# Patient Record
Sex: Male | Born: 1990 | Race: Black or African American | Hispanic: No | Marital: Single | Smoking: Never smoker
Health system: Southern US, Community
[De-identification: ages and names within clinical notes are randomized; demographics above are authoritative.]

## PROBLEM LIST (undated history)

## (undated) DIAGNOSIS — I10 Essential (primary) hypertension: Secondary | ICD-10-CM

## (undated) HISTORY — PX: APPENDECTOMY: SHX54

## (undated) HISTORY — PX: OTHER SURGICAL HISTORY: SHX169

## (undated) HISTORY — PX: TESTICLE SURGERY: SHX794

---

## 2008-05-12 ENCOUNTER — Emergency Department (HOSPITAL_BASED_OUTPATIENT_CLINIC_OR_DEPARTMENT_OTHER): Admission: EM | Admit: 2008-05-12 | Discharge: 2008-05-12 | Payer: Self-pay | Admitting: Emergency Medicine

## 2008-05-23 ENCOUNTER — Emergency Department (HOSPITAL_BASED_OUTPATIENT_CLINIC_OR_DEPARTMENT_OTHER): Admission: EM | Admit: 2008-05-23 | Discharge: 2008-05-23 | Payer: Self-pay | Admitting: Emergency Medicine

## 2010-02-19 ENCOUNTER — Emergency Department (HOSPITAL_BASED_OUTPATIENT_CLINIC_OR_DEPARTMENT_OTHER): Admission: EM | Admit: 2010-02-19 | Discharge: 2010-02-19 | Payer: Self-pay | Admitting: Internal Medicine

## 2011-03-26 LAB — STREP A DNA PROBE: Group A Strep Probe: NEGATIVE

## 2011-03-26 LAB — RAPID STREP SCREEN (MED CTR MEBANE ONLY): Streptococcus, Group A Screen (Direct): NEGATIVE

## 2013-03-16 ENCOUNTER — Encounter (HOSPITAL_BASED_OUTPATIENT_CLINIC_OR_DEPARTMENT_OTHER): Payer: Self-pay | Admitting: Emergency Medicine

## 2013-03-16 ENCOUNTER — Emergency Department (HOSPITAL_BASED_OUTPATIENT_CLINIC_OR_DEPARTMENT_OTHER)
Admission: EM | Admit: 2013-03-16 | Discharge: 2013-03-16 | Disposition: A | Discharge status: Home / Self Care | Payer: BC Managed Care – PPO | Attending: Emergency Medicine | Admitting: Emergency Medicine

## 2013-03-16 DIAGNOSIS — H53149 Visual discomfort, unspecified: Secondary | ICD-10-CM | POA: Insufficient documentation

## 2013-03-16 DIAGNOSIS — I10 Essential (primary) hypertension: Secondary | ICD-10-CM | POA: Insufficient documentation

## 2013-03-16 DIAGNOSIS — R51 Headache: Secondary | ICD-10-CM | POA: Insufficient documentation

## 2013-03-16 HISTORY — DX: Essential (primary) hypertension: I10

## 2013-03-16 NOTE — ED Notes (Signed)
HA yesterday and today.  3/10. Denies photophobia, N/V. Has not taken any otc meds today. Took 2 BC and Excederin MIgraine yesterday but it came back today.

## 2013-03-16 NOTE — ED Provider Notes (Signed)
CSN: [id number]     Arrival date & time 03/16/13  1629 History   First MD Initiated Contact with Patient 03/16/13 1746     Chief Complaint  Patient presents with  . Headache   (Consider location/radiation/quality/duration/timing/severity/associated sxs/prior Treatment) HPI Comments: 22 year old male presents with waxing and waning headache since 2 days ago. He says the headache started gradually and feels like a throbbing. The pain started in his occipital area but now is more located in the front. Pain is usually worse when he's doing activities. He denies any neck pain, blurry vision, fever, URI symptoms, numbness, or weakness. The pain improved after BC and Excedrin. The pain is improved currently and is only a 2/10. He does not currently want any medications. He gets headaches like these about once a month. He has not seen any doctors for this. He is most concerned about whether or not the headache has a serious cause.  The history is provided by the patient.    Past Medical History  Diagnosis Date  . Hypertension    Past Surgical History  Procedure Laterality Date  . Appendectomy     No family history on file. History  Substance Use Topics  . Smoking status: Never Smoker   . Smokeless tobacco: Never Used  . Alcohol Use: Yes     Comment: occ    Review of Systems  Constitutional: Negative for fever and chills.  HENT: Negative for congestion, rhinorrhea, neck pain and neck stiffness.   Eyes: Positive for photophobia (intermittently). Negative for pain and visual disturbance.  Respiratory: Negative for cough and shortness of breath.   Gastrointestinal: Negative for nausea and vomiting.  Musculoskeletal: Negative for gait problem.  Neurological: Positive for headaches. Negative for dizziness, weakness, light-headedness and numbness.  All other systems reviewed and are negative.    Allergies  Review of patient's allergies indicates no known allergies.  Home Medications   No current outpatient prescriptions on file. BP 143/60  Pulse 88  Temp(Src) 98.9 F (37.2 C) (Oral)  Resp 20  Ht 5\' 9"  (1.753 m)  Wt 220 lb (99.791 kg)  BMI 32.47 kg/m2  SpO2 99% Physical Exam  Nursing note and vitals reviewed. Constitutional: He is oriented to person, place, and time. He appears well-developed and well-nourished.  HENT:  Head: Normocephalic and atraumatic.  Right Ear: External ear normal.  Left Ear: External ear normal.  Nose: Nose normal.  Mouth/Throat: Oropharynx is clear and moist. No oropharyngeal exudate.  Eyes: Right eye exhibits no discharge. Left eye exhibits no discharge.  Neck: Normal range of motion and full passive range of motion without pain. Neck supple. No spinous process tenderness and no muscular tenderness present.  Pulmonary/Chest: Effort normal and breath sounds normal.  Abdominal: He exhibits no distension.  Musculoskeletal: He exhibits no edema.  Neurological: He is alert and oriented to person, place, and time. He has normal strength and normal reflexes. No cranial nerve deficit or sensory deficit. He exhibits normal muscle tone. Coordination and gait normal. GCS eye subscore is 4. GCS verbal subscore is 5. GCS motor subscore is 6.  Skin: Skin is warm and dry.    ED Course  Procedures (including critical care time) Labs Review Labs Reviewed - No data to display Imaging Review No results found.  MDM   1. Headache    Patient with recurrent headache. This pain has improved with symptomatic care at home. He presented today as he is concerned about a possible dangerous etiology. As he has a  normal neurologic exam, no visual symptoms, no cerebellar symptoms no neck stiffness or fever I feel acute intracranial pathology is very unlikely. I feel that as he has had symptoms like this for several years intermittently that this is more likely a migraine or tension-like headache. We'll treat symptomatically and recommended following up with a PCP  or headache specialist for further evaluation. Patient states he understands return precautions.    Audree Camel, MD 03/16/13 2112

## 2014-01-12 ENCOUNTER — Ambulatory Visit (INDEPENDENT_AMBULATORY_CARE_PROVIDER_SITE_OTHER): Payer: BC Managed Care – PPO | Admitting: Family Medicine

## 2014-01-12 VITALS — BP 112/80 | HR 68 | Temp 98.0°F | Resp 16 | Ht 69.0 in | Wt 224.0 lb

## 2014-01-12 DIAGNOSIS — Z0000 Encounter for general adult medical examination without abnormal findings: Secondary | ICD-10-CM

## 2014-01-12 LAB — POCT URINALYSIS DIPSTICK
Bilirubin, UA: NEGATIVE
Blood, UA: NEGATIVE
Glucose, UA: NEGATIVE
Ketones, UA: NEGATIVE
Leukocytes, UA: NEGATIVE
Nitrite, UA: NEGATIVE
Protein, UA: NEGATIVE
Spec Grav, UA: 1.025
Urobilinogen, UA: 1
pH, UA: 6
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
## 2014-01-13 ENCOUNTER — Encounter: Payer: Self-pay | Admitting: Family Medicine

## 2014-01-13 LAB — CBC
HCT: 44.3 % (ref 39.0–52.0)
Hemoglobin: 15.3 g/dL (ref 13.0–17.0)
MCH: 28.1 pg (ref 26.0–34.0)
MCHC: 34.5 g/dL (ref 30.0–36.0)
MCV: 81.3 fL (ref 78.0–100.0)
Platelets: 262 10*3/uL (ref 150–400)
RBC: 5.45 MIL/uL (ref 4.22–5.81)
RDW: 14.3 % (ref 11.5–15.5)
WBC: 9 10*3/uL (ref 4.0–10.5)

## 2014-01-13 LAB — COMPLETE METABOLIC PANEL WITHOUT GFR
Albumin: 4.2 g/dL (ref 3.5–5.2)
BUN: 15 mg/dL (ref 6–23)
CO2: 28 meq/L (ref 19–32)
Calcium: 9.4 mg/dL (ref 8.4–10.5)
Chloride: 103 meq/L (ref 96–112)
GFR, Est African American: >89 mL/min
GFR, Est Non African American: 78 mL/min
Glucose, Bld: 79 mg/dL (ref 70–99)
Sodium: 139 meq/L (ref 135–145)
Total Protein: 7.5 g/dL (ref 6.0–8.3)

## 2014-01-13 LAB — COMPLETE METABOLIC PANEL WITH GFR
ALT: 38 U/L (ref 0–53)
AST: 35 U/L (ref 0–37)
Alkaline Phosphatase: 46 U/L (ref 39–117)
Creat: 1.27 mg/dL (ref 0.50–1.35)
Potassium: 4.5 mEq/L (ref 3.5–5.3)
Total Bilirubin: 0.4 mg/dL (ref 0.2–1.2)

## 2014-01-13 LAB — LDL CHOLESTEROL, DIRECT: Direct LDL: 95 mg/dL

## 2014-01-13 LAB — TSH: TSH: 1.523 u[IU]/mL (ref 0.350–4.500)

## 2015-02-21 ENCOUNTER — Emergency Department (HOSPITAL_BASED_OUTPATIENT_CLINIC_OR_DEPARTMENT_OTHER)
Admission: EM | Admit: 2015-02-21 | Discharge: 2015-02-21 | Disposition: A | Discharge status: Home / Self Care | Payer: 59 | Attending: Emergency Medicine | Admitting: Emergency Medicine

## 2015-02-21 ENCOUNTER — Encounter (HOSPITAL_BASED_OUTPATIENT_CLINIC_OR_DEPARTMENT_OTHER): Payer: Self-pay | Admitting: Emergency Medicine

## 2015-02-21 ENCOUNTER — Emergency Department (HOSPITAL_BASED_OUTPATIENT_CLINIC_OR_DEPARTMENT_OTHER): Payer: 59

## 2015-02-21 DIAGNOSIS — Y998 Other external cause status: Secondary | ICD-10-CM | POA: Insufficient documentation

## 2015-02-21 DIAGNOSIS — X58XXXA Exposure to other specified factors, initial encounter: Secondary | ICD-10-CM | POA: Diagnosis not present

## 2015-02-21 DIAGNOSIS — Y92310 Basketball court as the place of occurrence of the external cause: Secondary | ICD-10-CM | POA: Diagnosis not present

## 2015-02-21 DIAGNOSIS — Y9367 Activity, basketball: Secondary | ICD-10-CM | POA: Diagnosis not present

## 2015-02-21 DIAGNOSIS — S93602A Unspecified sprain of left foot, initial encounter: Secondary | ICD-10-CM | POA: Diagnosis not present

## 2015-02-21 DIAGNOSIS — S99922A Unspecified injury of left foot, initial encounter: Secondary | ICD-10-CM | POA: Diagnosis present

## 2015-02-21 NOTE — ED Notes (Signed)
Patient had pain to his left foot x 1 day
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
## 2015-02-21 NOTE — Discharge Instructions (Signed)
Rest. Ice for 20 minutes every 2 hours while awake for the next 2 days.  Keep your foot elevated.  Ibuprofen 600 mg every 6 hours as needed for pain.  Follow-up with your primary Dr. if not improving in the next week.   Foot Sprain The muscles and cord like structures which attach muscle to bone (tendons) that surround the feet are made up of units. A foot sprain can occur at the weakest spot in any of these units. This condition is most often caused by injury to or overuse of the foot, as from playing contact sports, or aggravating a previous injury, or from poor conditioning, or obesity. SYMPTOMS  Pain with movement of the foot.  Tenderness and swelling at the injury site.  Loss of strength is present in moderate or severe sprains. THE THREE GRADES OR SEVERITY OF FOOT SPRAIN ARE:  Mild (Grade I): Slightly pulled muscle without tearing of muscle or tendon fibers or loss of strength.  Moderate (Grade II): Tearing of fibers in a muscle, tendon, or at the attachment to bone, with small decrease in strength.  Severe (Grade III): Rupture of the muscle-tendon-bone attachment, with separation of fibers. Severe sprain requires surgical repair. Often repeating (chronic) sprains are caused by overuse. Sudden (acute) sprains are caused by direct injury or over-use. DIAGNOSIS  Diagnosis of this condition is usually by your own observation. If problems continue, a caregiver may be required for further evaluation and treatment. X-rays may be required to make sure there are not breaks in the bones (fractures) present. Continued problems may require physical therapy for treatment. PREVENTION  Use strength and conditioning exercises appropriate for your sport.  Warm up properly prior to working out.  Use athletic shoes that are made for the sport you are participating in.  Allow adequate time for healing. Early return to activities makes repeat injury more likely, and can lead to an unstable  arthritic foot that can result in prolonged disability. Mild sprains generally heal in 3 to 10 days, with moderate and severe sprains taking 2 to 10 weeks. Your caregiver can help you determine the proper time required for healing. HOME CARE INSTRUCTIONS   Apply ice to the injury for 15-20 minutes, 03-04 times per day. Put the ice in a plastic bag and place a towel between the bag of ice and your skin.  An elastic wrap (like an Ace bandage) may be used to keep swelling down.  Keep foot above the level of the heart, or at least raised on a footstool, when swelling and pain are present.  Try to avoid use other than gentle range of motion while the foot is painful. Do not resume use until instructed by your caregiver. Then begin use gradually, not increasing use to the point of pain. If pain does develop, decrease use and continue the above measures, gradually increasing activities that do not cause discomfort, until you gradually achieve normal use.  Use crutches if and as instructed, and for the length of time instructed.  Keep injured foot and ankle wrapped between treatments.  Massage foot and ankle for comfort and to keep swelling down. Massage from the toes up towards the knee.  Only take over-the-counter or prescription medicines for pain, discomfort, or fever as directed by your caregiver. SEEK IMMEDIATE MEDICAL CARE IF:   Your pain and swelling increase, or pain is not controlled with medications.  You have loss of feeling in your foot or your foot turns cold or blue.  You  develop new, unexplained symptoms, or an increase of the symptoms that brought you to your caregiver. MAKE SURE YOU:   Understand these instructions.  Will watch your condition.  Will get help right away if you are not doing well or get worse. Document Released: 11/30/2001 Document Revised: 09/02/2011 Document Reviewed: 01/28/2008 [facility] Patient Information 2015 [place], [place]. This information is not  intended to replace advice given to you by your health care provider. Make sure you discuss any questions you have with your health care provider.

## 2015-05-04 ENCOUNTER — Encounter (HOSPITAL_BASED_OUTPATIENT_CLINIC_OR_DEPARTMENT_OTHER): Payer: Self-pay

## 2015-05-04 ENCOUNTER — Emergency Department (HOSPITAL_BASED_OUTPATIENT_CLINIC_OR_DEPARTMENT_OTHER): Payer: 59

## 2015-05-04 ENCOUNTER — Emergency Department (HOSPITAL_BASED_OUTPATIENT_CLINIC_OR_DEPARTMENT_OTHER)
Admission: EM | Admit: 2015-05-04 | Discharge: 2015-05-04 | Disposition: A | Discharge status: Home / Self Care | Payer: 59 | Attending: Emergency Medicine | Admitting: Emergency Medicine

## 2015-05-04 DIAGNOSIS — S8992XA Unspecified injury of left lower leg, initial encounter: Secondary | ICD-10-CM | POA: Insufficient documentation

## 2015-05-04 DIAGNOSIS — S39012A Strain of muscle, fascia and tendon of lower back, initial encounter: Secondary | ICD-10-CM | POA: Insufficient documentation

## 2015-05-04 DIAGNOSIS — S99922A Unspecified injury of left foot, initial encounter: Secondary | ICD-10-CM | POA: Diagnosis not present

## 2015-05-04 DIAGNOSIS — Y92410 Unspecified street and highway as the place of occurrence of the external cause: Secondary | ICD-10-CM | POA: Insufficient documentation

## 2015-05-04 DIAGNOSIS — M79672 Pain in left foot: Secondary | ICD-10-CM

## 2015-05-04 DIAGNOSIS — Y9389 Activity, other specified: Secondary | ICD-10-CM | POA: Diagnosis not present

## 2015-05-04 DIAGNOSIS — Y998 Other external cause status: Secondary | ICD-10-CM | POA: Diagnosis not present

## 2015-05-04 DIAGNOSIS — M25562 Pain in left knee: Secondary | ICD-10-CM

## 2015-05-04 MED ORDER — IBUPROFEN 600 MG PO TABS: 600.0000 mg | ORAL_TABLET | Freq: Four times a day (QID) | ORAL | Status: AC | PRN

## 2015-05-04 NOTE — ED Notes (Signed)
np at bedside
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
## 2015-05-04 NOTE — Discharge Instructions (Signed)

## 2015-05-06 ENCOUNTER — Emergency Department (HOSPITAL_BASED_OUTPATIENT_CLINIC_OR_DEPARTMENT_OTHER): Payer: 59

## 2015-05-06 ENCOUNTER — Emergency Department (HOSPITAL_BASED_OUTPATIENT_CLINIC_OR_DEPARTMENT_OTHER)
Admission: EM | Admit: 2015-05-06 | Discharge: 2015-05-06 | Disposition: A | Discharge status: Home / Self Care | Payer: 59 | Attending: Physician Assistant | Admitting: Physician Assistant

## 2015-05-06 ENCOUNTER — Encounter (HOSPITAL_BASED_OUTPATIENT_CLINIC_OR_DEPARTMENT_OTHER): Payer: Self-pay | Admitting: *Deleted

## 2015-05-06 DIAGNOSIS — R030 Elevated blood-pressure reading, without diagnosis of hypertension: Secondary | ICD-10-CM | POA: Diagnosis not present

## 2015-05-06 DIAGNOSIS — Y990 Civilian activity done for income or pay: Secondary | ICD-10-CM | POA: Diagnosis not present

## 2015-05-06 DIAGNOSIS — Y9389 Activity, other specified: Secondary | ICD-10-CM | POA: Diagnosis not present

## 2015-05-06 DIAGNOSIS — S39012A Strain of muscle, fascia and tendon of lower back, initial encounter: Secondary | ICD-10-CM | POA: Insufficient documentation

## 2015-05-06 DIAGNOSIS — X58XXXA Exposure to other specified factors, initial encounter: Secondary | ICD-10-CM | POA: Insufficient documentation

## 2015-05-06 DIAGNOSIS — S3992XA Unspecified injury of lower back, initial encounter: Secondary | ICD-10-CM | POA: Diagnosis present

## 2015-05-06 DIAGNOSIS — IMO0001 Reserved for inherently not codable concepts without codable children: Secondary | ICD-10-CM

## 2015-05-06 DIAGNOSIS — Y9289 Other specified places as the place of occurrence of the external cause: Secondary | ICD-10-CM | POA: Diagnosis not present

## 2015-05-06 MED ORDER — METHOCARBAMOL 500 MG PO TABS: 1000.0000 mg | ORAL_TABLET | Freq: Four times a day (QID) | ORAL | Status: AC | PRN
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
## 2015-05-06 NOTE — Discharge Instructions (Signed)
You can also take  tylenol (acetaminophen) 975mg  (this is 3 over the counter pills) four times a day. Do not drink alcohol or combine with other medications that have acetaminophen as an ingredient (Read the labels!).    For breakthrough pain you may take Robaxin. Do not drink alcohol, drive or operate heavy machinery when taking Robaxin.  Do not hesitate to return to the emergency room for any new, worsening or concerning symptoms.  Please obtain primary care using resource guide below. Let them know that you were seen in the emergency room and that they will need to obtain records for further outpatient management.  Please follow with your primary care doctor in the next 5 days for high blood pressure evaluation. If you do not have a primary care doctor, present to urgent care. Reduce salt intake. Seek emergency medical care for unilateral weakness, slurring, change in vision, or chest pain and shortness of breath.  Emergency Department Resource Guide 1) Find a Doctor and Pay Out of Pocket Although you won't have to find out who is covered by your insurance plan, it is a good idea to ask around and get recommendations. You will then need to call the office and see if the doctor you have chosen will accept you as a new patient and what types of options they offer for patients who are self-pay. Some doctors offer discounts or will set up payment plans for their patients who do not have insurance, but you will need to ask so you aren't surprised when you get to your appointment.  2) Contact Your Local Health Department Not all health departments have doctors that can see patients for sick visits, but many do, so it is worth a call to see if yours does. If you don't know where your local health department is, you can check in your phone book. The CDC also has a tool to help you locate your state's health department, and many state websites also have listings of all of their local health  departments.  3) Find a Walk-in Clinic If your illness is not likely to be very severe or complicated, you may want to try a walk in clinic. These are popping up all over the country in pharmacies, drugstores, and shopping centers. They're usually staffed by nurse practitioners or physician assistants that have been trained to treat common illnesses and complaints. They're usually fairly quick and inexpensive. However, if you have serious medical issues or chronic medical problems, these are probably not your best option.  No Primary Care Doctor: - Call Health Connect at  [phone] - they can help you locate a primary care doctor that  accepts your insurance, provides certain services, etc. - Physician Referral Service- [phone]  Chronic Pain Problems: Organization         Address  Phone   Notes  [facility]  [phone] Patients need to be referred by their primary care doctor.   Medication Assistance: Organization         Address  Phone   Notes  [facility] Medication [facility] [address] [place], [place] [phone] --Must be a resident of [place] -- Must have NO insurance coverage whatsoever (no Medicaid/ Medicare, etc.) -- The pt. MUST have a primary care doctor that directs their care regularly and follows them in the community   MedAssist  [phone]) [phone]   [employer]  [phone]    Agencies that provide inexpensive  medical care: Organization         Address  Phone   Notes  [place]  [phone]   [facility]    [phone]   [facility] [place], [place] [address] [phone]   [place] [address] [place] [phone]   Planned Parenthood    [phone]   [facility]    [phone]   [place] and [place] [address], [place] Phone:  [phone], Fax:  [phone] Hours of Operation:  9 am - 6 pm, M-F.  Also accepts Medicaid/Medicare and self-pay.  [facility] for [address], [address], [place] Phone: [phone], Fax: [phone]. Hours of Operation:  8:30 am - 5:30 pm, M-F.  Also accepts Medicaid and self-pay.  [facility] [address] [place] Phone: [phone]   [place], [place], [place] [phone], Ext. [phone] Mondays & Thursdays: 7-9 AM.  First 15 patients are seen on a first come, first serve basis.    [place] Providers:  Organization         Address  Phone   Notes  [facility] [address], [place] [phone] Also accepts self-pay patients.  [facility] [address], [place]  [phone]   [place], [address], [place] [phone]   [facility] [address] [place] [phone]   Lucianne Lei [address], [place]   [phone] Only accepts [place] Access [place] patients after they have their name applied to their card.   Self-Pay (no insurance) in [facility]:  Organization         Address  Phone   Notes  Sickle Cell Patients, [facility] [place] [phone]   [facility] [place] [phone]   [facility] [place]  [place], [place], [place] [phone]   [facility]/Dr. Osei-Bonsu  [address], [place] or [address], [place] [phone] Phone number for both [place] and [place] locations is the same.  Urgent Medical and [facility] [address] [place] [phone]   [facility] [address], [place] or [address] [phone]   [facility] [address], [place] [phone], phone; [phone], fax Sees patients 1st and 3rd Saturday of every month.  Must not qualify for public or private insurance (i.e. Medicaid, Medicare, [place] Health Choice, Veterans' Benefits)  Household income should be no more than 200% of the poverty level The clinic cannot treat you if you are pregnant or think you are pregnant  Sexually transmitted diseases are not treated at the clinic.    Dental Care: Organization         Address  Phone  Notes  [facility] [place] [phone] Accepts children up to age 76 who are enrolled in [place] or [place]; pregnant women with a Medicaid card; and children who have applied for Medicaid or [place] Health Choice, but  were declined, whose parents can pay a reduced fee at time of service.  [facility] of [facility]  [address], [place] [phone] Accepts children up to age 67 who are enrolled in [place] or [place]; pregnant women with a Medicaid card; and children who have applied for Medicaid or [place] Health Choice, but were declined, whose parents can pay a reduced fee at time of service.  [place] Adult Dental Access PROGRAM  [place] [phone] Patients are seen by appointment only. Walk-ins are not accepted. [place] will see patients 53 years of age and older. Monday - Tuesday (8am-5pm) Most Wednesdays (8:30-5pm) $30 per visit, cash only  [facility] Adult Dental Access PROGRAM  [address], [facility] [phone] Patients are seen by appointment only. Walk-ins are not accepted. [place] will see patients 52 years of age and older. One Wednesday Evening (Monthly: Volunteer Based).  $30 per visit, cash only  [place]  [phone] for adults;  Children under age 40, call Graduate Pediatric Dentistry at [phone]. Children aged 63-14, please call [phone] to request a pediatric application.  Dental services are provided in all areas of dental care including fillings, crowns and bridges, complete and partial dentures, implants, gum treatment, root canals, and extractions. Preventive care is also provided. Treatment is provided to both adults and children. Patients are selected via a lottery and there is often a waiting list.   [facility] [address] [place]  [phone] [email or web address]   [facility] [address] [place], [place] [phone], Ext. [phone] Second and Fourth Thursday of each month, opens at 6:30 AM; Clinic ends at 9 AM.  Patients are seen on a first-come first-served basis, and a limited number are seen during each clinic.   [facility]  [address] Hillard Danker [place], [place] [phone]   Eligibility Requirements You must have lived in [place], [place], or [place] counties for at least the last three months.   You cannot be eligible for state or federal sponsored [employer], including [employer], [place], or [employer].   You generally cannot be eligible for healthcare insurance through your employer.    How to apply: Eligibility screenings are held every Tuesday and Wednesday afternoon from 1:00 pm until 4:00 pm. You do not need an appointment for the interview!  [facility] [address], [place], [place]   [place]  [place] Department  [place]  [phone]    Behavioral Health Resources in the Community: Intensive Outpatient Programs Organization         Address  Phone  Notes  [place]. [address], [place], [place] [phone]   [facility] Outpatient [address] [place], [place]   ADS: Alcohol & Drug Svcs [address] [place], [place]   [place] [address]  [place], [place] or [phone]   Substance Abuse Resources Organization         Address  Phone  Notes  Alcohol and Drug Services  [phone]   [facility]  [phone]   [facility] [place]  [phone]   Chinita Pester  [phone]   Residential & Outpatient Substance Abuse Program  [phone]   Psychological Services  Organization         Address  Phone  Notes  [place]  [place]  [phone]   [place] [address], [place] or [phone]    Mobile Crisis Teams Organization         Address  Phone  Notes  [facility], Mobile Crisis Care Unit  [phone]   [facility]  [address] [place], [place]   [place] [address], [place] [phone]    Self-Help/Support Groups Organization         Address  Phone             Notes  [place]. of [place] - variety of support groups  [place] Call for more information  Narcotics Anonymous (NA), [facility] [address], [employer] [place]  2 meetings at this location   Special educational needs teacher         Address  Phone  Notes  [facility]idential [facility] [place],    [place]  [phone]   [facility]  [address] [place] [address]84696, [place], [place]   [place], [place] [phone] Admissions: 8am-3pm M-F  Incentives Substance [place] [address],    [place], [place] [phone]   [facility] [address] [place], [place], [place]   [facility] [address]  [place], [place]   [facility] Programs - Intensive  Outpatient [address], [place], [place]   [facility] [address]  [place], [place] [phone] or [phone]   [facility] ([facility]) [address], [place], [place] Accepts Medicaid  [facility] [place] [address],  [place] [phone] Substance Abuse/Addiction Treatment   [facility] Organization         Address  Phone  Notes  [facility]  [phone]   Domenic Schwab, PhD [address] Arlis Porta [place], [place]   [phone] or [phone]   [place], [place] [phone]   [facility] [address] [place], [place] [phone] Insurance/Medicaid/sponsorship through [facility] and Families [address], [address]                                    [place], [place] [phone] [place] [address], [place] [phone]    Dr. Adele Schilder  [phone]   [facility] Dept. 1) [address], [place] 2) [place] 3)  [address], [place] [phone]  [phone]   [place] [phone] or [phone] (After Hours)

## 2015-05-06 NOTE — ED Notes (Signed)
Pt reports low back pain since yesterday, worse today

## 2016-01-09 ENCOUNTER — Ambulatory Visit (INDEPENDENT_AMBULATORY_CARE_PROVIDER_SITE_OTHER): Payer: Self-pay

## 2016-01-09 ENCOUNTER — Other Ambulatory Visit: Payer: Self-pay | Admitting: Adult Health

## 2016-01-09 DIAGNOSIS — R52 Pain, unspecified: Secondary | ICD-10-CM

## 2016-01-09 DIAGNOSIS — M79642 Pain in left hand: Secondary | ICD-10-CM

## 2016-01-09 DIAGNOSIS — M25532 Pain in left wrist: Secondary | ICD-10-CM
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
## 2017-11-12 ENCOUNTER — Emergency Department (HOSPITAL_BASED_OUTPATIENT_CLINIC_OR_DEPARTMENT_OTHER): Payer: 59

## 2017-11-12 ENCOUNTER — Encounter (HOSPITAL_BASED_OUTPATIENT_CLINIC_OR_DEPARTMENT_OTHER): Payer: Self-pay

## 2017-11-12 ENCOUNTER — Emergency Department (HOSPITAL_BASED_OUTPATIENT_CLINIC_OR_DEPARTMENT_OTHER)
Admission: EM | Admit: 2017-11-12 | Discharge: 2017-11-12 | Disposition: A | Discharge status: Home / Self Care | Payer: 59 | Attending: Emergency Medicine | Admitting: Emergency Medicine

## 2017-11-12 ENCOUNTER — Other Ambulatory Visit: Payer: Self-pay

## 2017-11-12 DIAGNOSIS — Z79899 Other long term (current) drug therapy: Secondary | ICD-10-CM | POA: Insufficient documentation

## 2017-11-12 DIAGNOSIS — R748 Abnormal levels of other serum enzymes: Secondary | ICD-10-CM | POA: Insufficient documentation

## 2017-11-12 DIAGNOSIS — R9431 Abnormal electrocardiogram [ECG] [EKG]: Secondary | ICD-10-CM | POA: Diagnosis not present

## 2017-11-12 DIAGNOSIS — I10 Essential (primary) hypertension: Secondary | ICD-10-CM | POA: Diagnosis not present

## 2017-11-12 DIAGNOSIS — R1011 Right upper quadrant pain: Secondary | ICD-10-CM

## 2017-11-12 DIAGNOSIS — R778 Other specified abnormalities of plasma proteins: Secondary | ICD-10-CM

## 2017-11-12 DIAGNOSIS — R7989 Other specified abnormal findings of blood chemistry: Secondary | ICD-10-CM

## 2017-11-12 LAB — URINALYSIS, ROUTINE W REFLEX MICROSCOPIC
BILIRUBIN URINE: NEGATIVE
GLUCOSE, UA: NEGATIVE mg/dL
HGB URINE DIPSTICK: NEGATIVE
KETONES UR: NEGATIVE mg/dL
Leukocytes, UA: NEGATIVE
Nitrite: NEGATIVE
PH: 7 (ref 5.0–8.0)
Protein, ur: NEGATIVE mg/dL
SPECIFIC GRAVITY, URINE: 1.02 (ref 1.005–1.030)

## 2017-11-12 LAB — CBC WITH DIFFERENTIAL/PLATELET
BASOS PCT: 1 %
Basophils Absolute: 0.1 10*3/uL (ref 0.0–0.1)
Eosinophils Absolute: 0.2 10*3/uL (ref 0.0–0.7)
Eosinophils Relative: 2 %
HEMATOCRIT: 45.9 % (ref 39.0–52.0)
Hemoglobin: 16 g/dL (ref 13.0–17.0)
LYMPHS ABS: 2.8 10*3/uL (ref 0.7–4.0)
LYMPHS PCT: 31 %
MCH: 28.9 pg (ref 26.0–34.0)
MCHC: 34.9 g/dL (ref 30.0–36.0)
MCV: 83 fL (ref 78.0–100.0)
MONO ABS: 1.1 10*3/uL — AB (ref 0.1–1.0)
MONOS PCT: 12 %
NEUTROS ABS: 5 10*3/uL (ref 1.7–7.7)
Neutrophils Relative %: 54 %
Platelets: 216 10*3/uL (ref 150–400)
RBC: 5.53 MIL/uL (ref 4.22–5.81)
RDW: 12.6 % (ref 11.5–15.5)
WBC: 9.2 10*3/uL (ref 4.0–10.5)

## 2017-11-12 LAB — COMPREHENSIVE METABOLIC PANEL
ALK PHOS: 45 U/L (ref 38–126)
ALT: 30 U/L (ref 17–63)
ANION GAP: 8 (ref 5–15)
AST: 31 U/L (ref 15–41)
Albumin: 3.8 g/dL (ref 3.5–5.0)
BILIRUBIN TOTAL: 0.6 mg/dL (ref 0.3–1.2)
BUN: 15 mg/dL (ref 6–20)
CALCIUM: 8.9 mg/dL (ref 8.9–10.3)
CO2: 27 mmol/L (ref 22–32)
CREATININE: 1.21 mg/dL (ref 0.61–1.24)
Chloride: 101 mmol/L (ref 101–111)
GFR calc Af Amer: >60 mL/min (ref >60)
GFR calc non Af Amer: >60 mL/min (ref >60)
Glucose, Bld: 91 mg/dL (ref 65–99)
Potassium: 4 mmol/L (ref 3.5–5.1)
SODIUM: 136 mmol/L (ref 135–145)
TOTAL PROTEIN: 7.6 g/dL (ref 6.5–8.1)

## 2017-11-12 LAB — TROPONIN I
Troponin I: 0.05 ng/mL (ref <0.03)
Troponin I: 0.06 ng/mL (ref <0.03)

## 2017-11-12 LAB — LIPASE, BLOOD: Lipase: 23 U/L (ref 11–51)

## 2017-11-12 MED ORDER — GI COCKTAIL ~~LOC~~
30.0000 mL | Freq: Once | ORAL | Status: AC
  Administered 2017-11-12: 30 mL via ORAL
  Filled 2017-11-12 (×3): qty 30

## 2017-11-12 MED ORDER — AMLODIPINE BESYLATE 2.5 MG PO TABS: 2.5000 mg | ORAL_TABLET | Freq: Every day | ORAL | 0 refills | Status: AC

## 2017-11-12 MED ORDER — FAMOTIDINE 20 MG PO TABS
20.0000 mg | ORAL_TABLET | Freq: Once | ORAL | Status: AC
  Administered 2017-11-12: 20 mg via ORAL
  Filled 2017-11-12 (×2): qty 1

## 2017-11-12 MED ORDER — AMLODIPINE BESYLATE 5 MG PO TABS
2.5000 mg | ORAL_TABLET | Freq: Once | ORAL | Status: AC
  Administered 2017-11-12: 2.5 mg via ORAL
  Filled 2017-11-12: qty 1

## 2017-11-12 MED ORDER — ASPIRIN 81 MG PO CHEW
324.0000 mg | CHEWABLE_TABLET | Freq: Once | ORAL | Status: AC
  Administered 2017-11-12: 324 mg via ORAL
  Filled 2017-11-12: qty 4

## 2017-11-12 MED ORDER — RANITIDINE HCL 150 MG PO TABS: 150.0000 mg | ORAL_TABLET | Freq: Two times a day (BID) | ORAL | 0 refills | Status: AC

## 2017-11-12 NOTE — ED Notes (Signed)
Critical trop of 0.06 told to EDP

## 2017-11-12 NOTE — ED Notes (Signed)
Consult placed with [facility]
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
## 2017-11-12 NOTE — ED Notes (Signed)
IV attempted x 1-unsuccessful.  Blood obtained without difficulty

## 2017-11-12 NOTE — Care Management (Deleted)
  Dear Thayer Ohm,  I received a page from [facility] in the call center to call [facility] regarding a patient at [facility]. Alvis Lemmings).  The PA I spoke with Lenora Boys) was quite frustrated because she had been trying to reach someone for quite some time.  She documented in her note that at 6:01 PM she had been waiting on a return call.  She documented that the page had been sent out twice.  I spoke with the call center who checked with the flow manager stating that they just had gone to  Dr. Dow Adolph.  The PA, [address], was very frustrated when I explained to her that the patient did not require admission.  This is a 27 year old with a history of hypertension that is untreated who has a primary care doctor named Caralee Ates in [place].  The patient had an EKG that showed LVH with repolarization and a troponin of 0.05.  She had discussed the case with Dr. Allyson Sabal who said the patient did not require admission to cardiology.  He did say that if the patient required admission he should go to medicine.  Ms. Casper Harrison was very concerned that the EKG was grossly abnormal with flipped T waves.  I did try to discuss with her that it showed LVH with repolarization abnormalities. I called Dr. Allyson Sabal who confirmed, as I suspected, that the the level of suspicion for this patient was very low and that he recommended no further cardiac evaluation.  I did then call back and speak with the attending in the ER and she agreed my assessment.  I had recommended that they consider repeating the troponin in the patient and if it is the same or lower that the patient could be discharged home with follow-up with his primary care physician.  I also recommended that they treat the patient for his blood pressure.  The PA then asked me for blood pressure medication recommendations.  I suggested she talk to her attending but did make a few suggestions including amlodipine or an ACE inhibitor she suggested hydrochlorothiazide  I said that might work as it was inexpensive.  I think the situation was very uncomfortable for the PA because she was waiting a long time for a call back.  When she spoke with me and I told her that I did not believe the patient needed admission, it became more of a back and forth between [place].  I think that had the call been returned promptly she would have been less frustrated with the whole situation.  I think the whole conversation went poorly because she had been so frustrated by not getting a call back.

## 2017-11-12 NOTE — Discharge Instructions (Addendum)
Your symptoms could be from indigestion, however it could also be from a problem with your heart; it's very important that you see your regular doctor in the next 2-3 days for ongoing evaluation of your symptoms. You will need to take zantac as directed, and avoid spicy/fatty/acidic foods, avoid soda/coffee/tea/alcohol. Avoid laying down flat within 30 minutes of eating. Avoid NSAIDs like ibuprofen/aleve/motrin/etc on an empty stomach. May consider using over the counter tums/maalox as needed for additional relief. Use tylenol as needed for pain.  Your blood pressure was elevated today. Eat a low salt/low sodium diet, and start taking the blood pressure medication prescribed to you today. Keep a log of your blood pressure readings from every morning and evening (making sure to give yourself at least 15 minutes of rest prior to checking it) and take it to your doctor's office at your next appointment for ongoing management of your blood pressure. Stay well hydrated and get plenty of rest. Avoid caffeine and other over the counter products that would make your blood pressure go up (such as decongestants, excedrin, etc).   Follow up with your regular doctor in 2-3 days for recheck of symptoms and ongoing management of your blood pressure, as well as ongoing evaluation of possible heart problems. Return to the ER for emergent changes or worsening symptoms.

## 2017-11-12 NOTE — ED Triage Notes (Signed)
C/o RUQ pain x 3 days-NAD-steady gait

## 2019-05-09 IMAGING — DX DG CHEST 2V
2 series · 2 of 2 positions shown · non-contrast
Comparison: None.

CLINICAL DATA: Upper abdominal and mid chest pain.

EXAM:
CHEST - 2 VIEW

[chest pa]
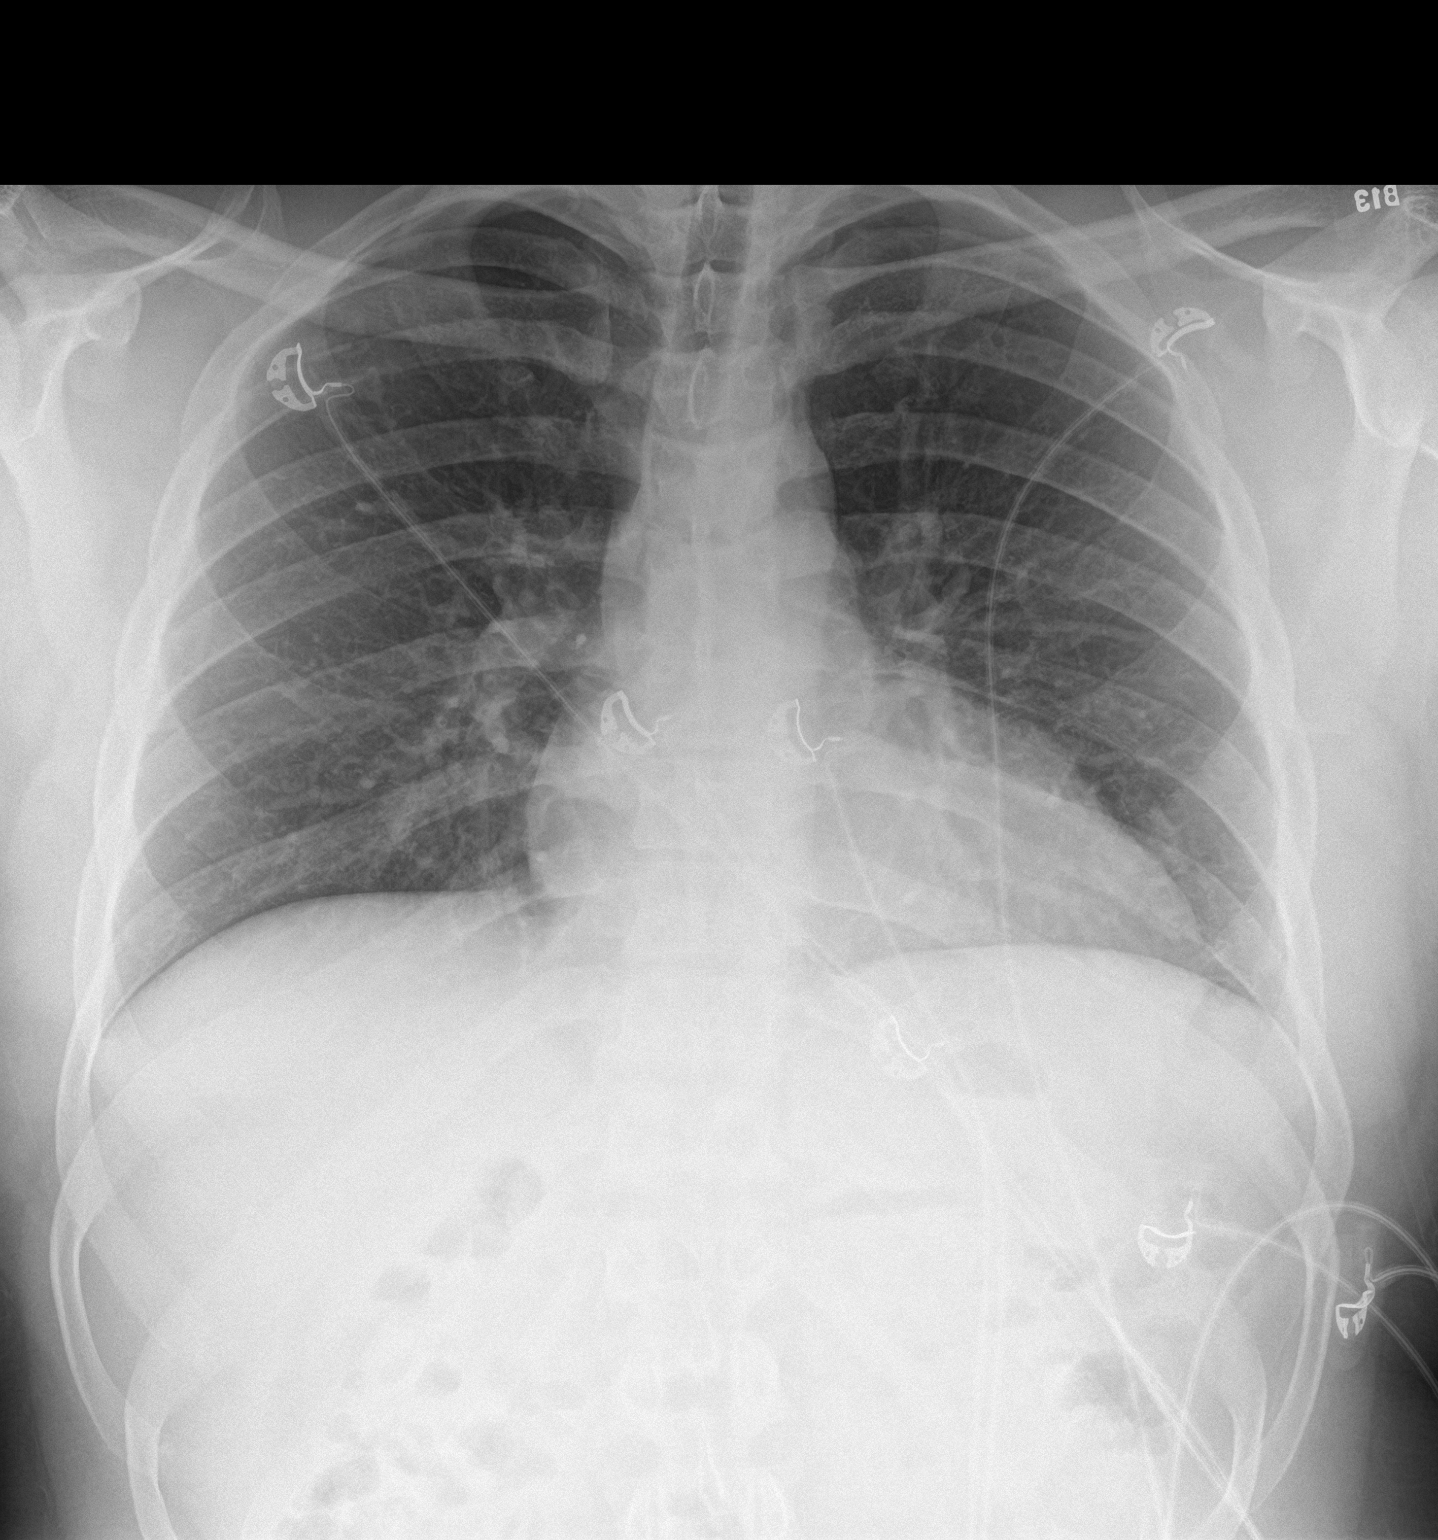

[chest lat]
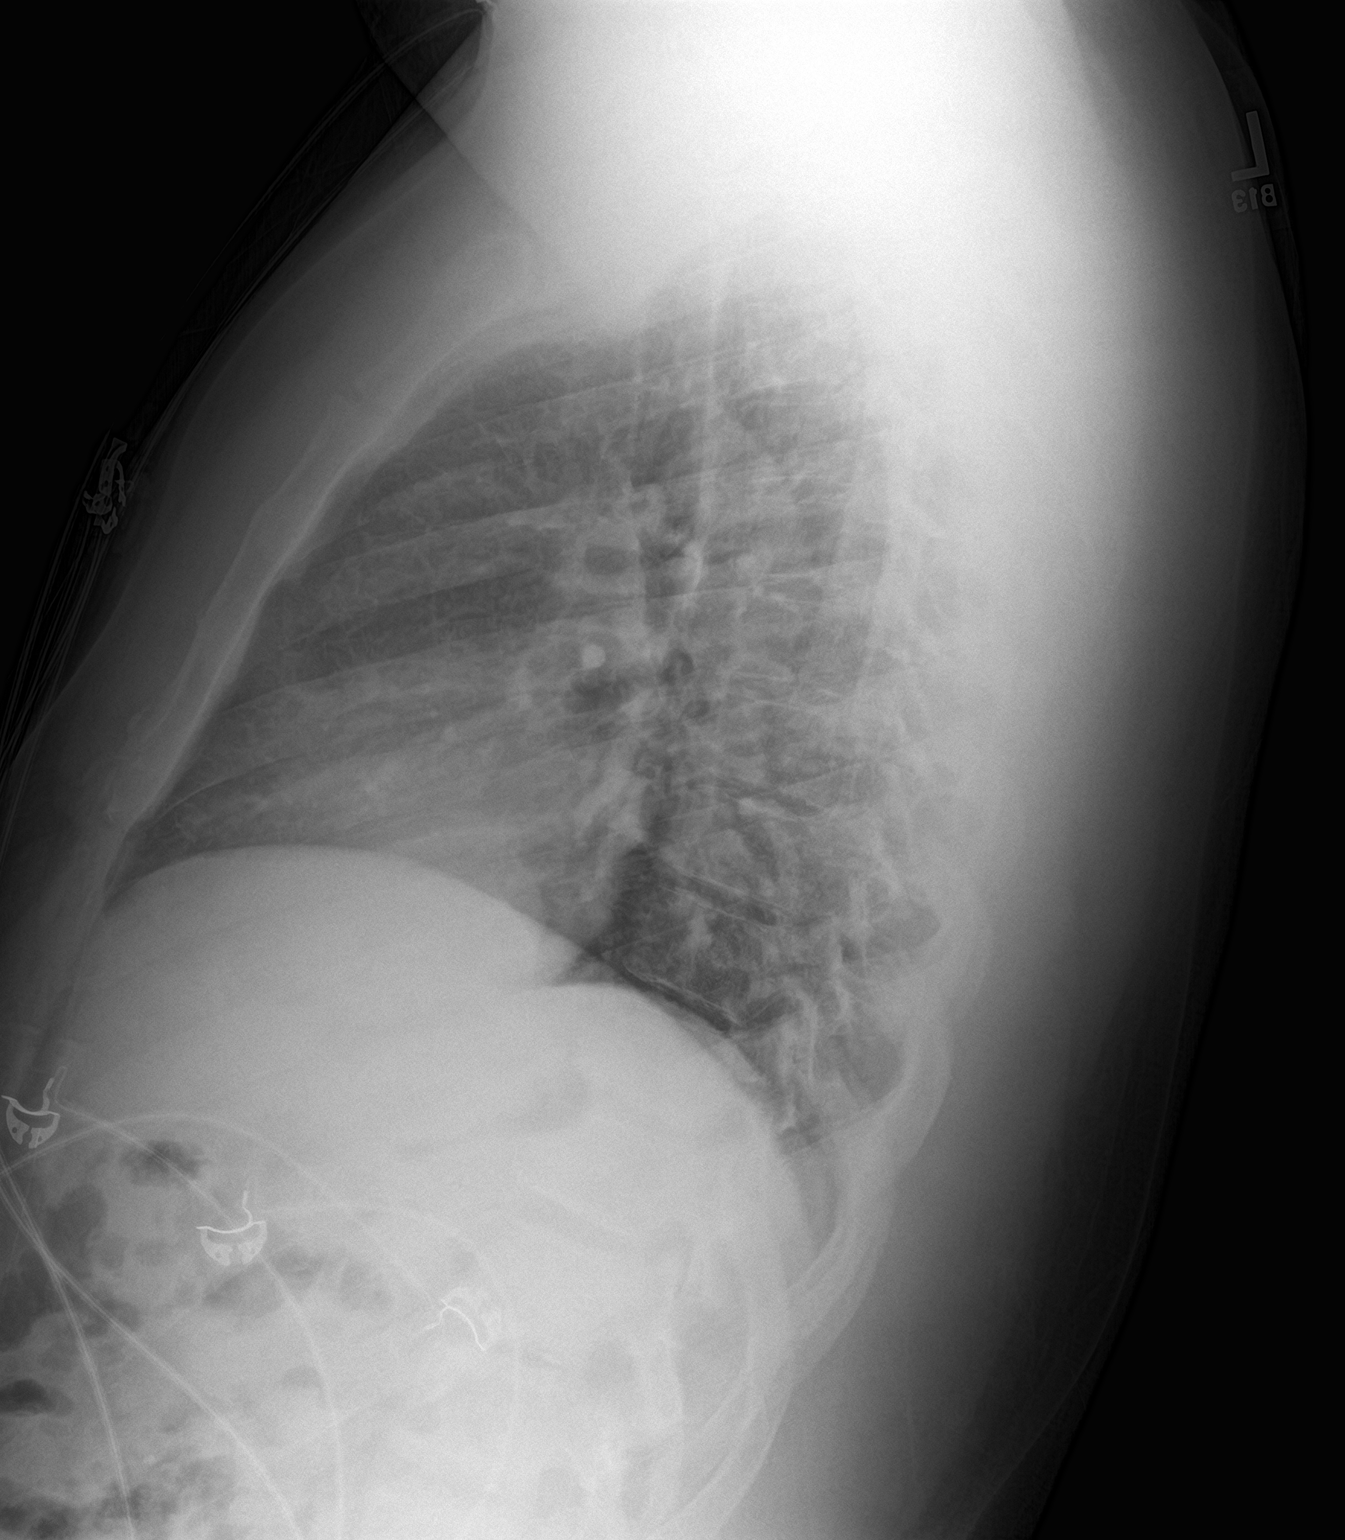

[2 of 2 positions shown; findings below may reference images not displayed]

FINDINGS: Trachea is midline. Heart size is accentuated by somewhat low lung
volumes. Lungs are clear. No pleural fluid.
IMPRESSION: No acute findings.
